# Patient Record
Sex: Male | Born: 1970 | Race: Black or African American | Hispanic: No | Marital: Single | Smoking: Never smoker
Health system: Southern US, Community
[De-identification: ages and names within clinical notes are randomized; demographics above are authoritative.]

## PROBLEM LIST (undated history)

## (undated) DIAGNOSIS — J309 Allergic rhinitis, unspecified: Secondary | ICD-10-CM

## (undated) DIAGNOSIS — T148XXA Other injury of unspecified body region, initial encounter: Secondary | ICD-10-CM

## (undated) DIAGNOSIS — L309 Dermatitis, unspecified: Secondary | ICD-10-CM

## (undated) DIAGNOSIS — R51 Headache: Secondary | ICD-10-CM

## (undated) DIAGNOSIS — K056 Periodontal disease, unspecified: Secondary | ICD-10-CM

## (undated) DIAGNOSIS — K649 Unspecified hemorrhoids: Secondary | ICD-10-CM

## (undated) DIAGNOSIS — E559 Vitamin D deficiency, unspecified: Secondary | ICD-10-CM

## (undated) DIAGNOSIS — B351 Tinea unguium: Secondary | ICD-10-CM

## (undated) HISTORY — DX: Periodontal disease, unspecified: K05.6

## (undated) HISTORY — DX: Other injury of unspecified body region, initial encounter: T14.8XXA

## (undated) HISTORY — DX: Allergic rhinitis, unspecified: J30.9

## (undated) HISTORY — DX: Unspecified hemorrhoids: K64.9

## (undated) HISTORY — DX: Dermatitis, unspecified: L30.9

## (undated) HISTORY — DX: Headache: R51

## (undated) HISTORY — DX: Tinea unguium: B35.1

## (undated) HISTORY — DX: Vitamin D deficiency, unspecified: E55.9

---

## 2003-05-06 ENCOUNTER — Encounter: Admission: RE | Admit: 2003-05-06 | Discharge: 2003-05-06 | Payer: Self-pay | Admitting: Internal Medicine

## 2005-10-31 IMAGING — CR DG SHOULDER 2+V*R*
3 series · 3 of 3 positions shown · non-contrast
Comparison: none

CLINICAL DATA: Right shoulder pain.  No trauma. 
 RIGHT SHOULDER

  There is no evidence of fracture or dislocation. No other significant bone or soft tissue abnormalities are identified.
 IMPRESSION
 Normal study.

[view not recorded (1 of 3)]
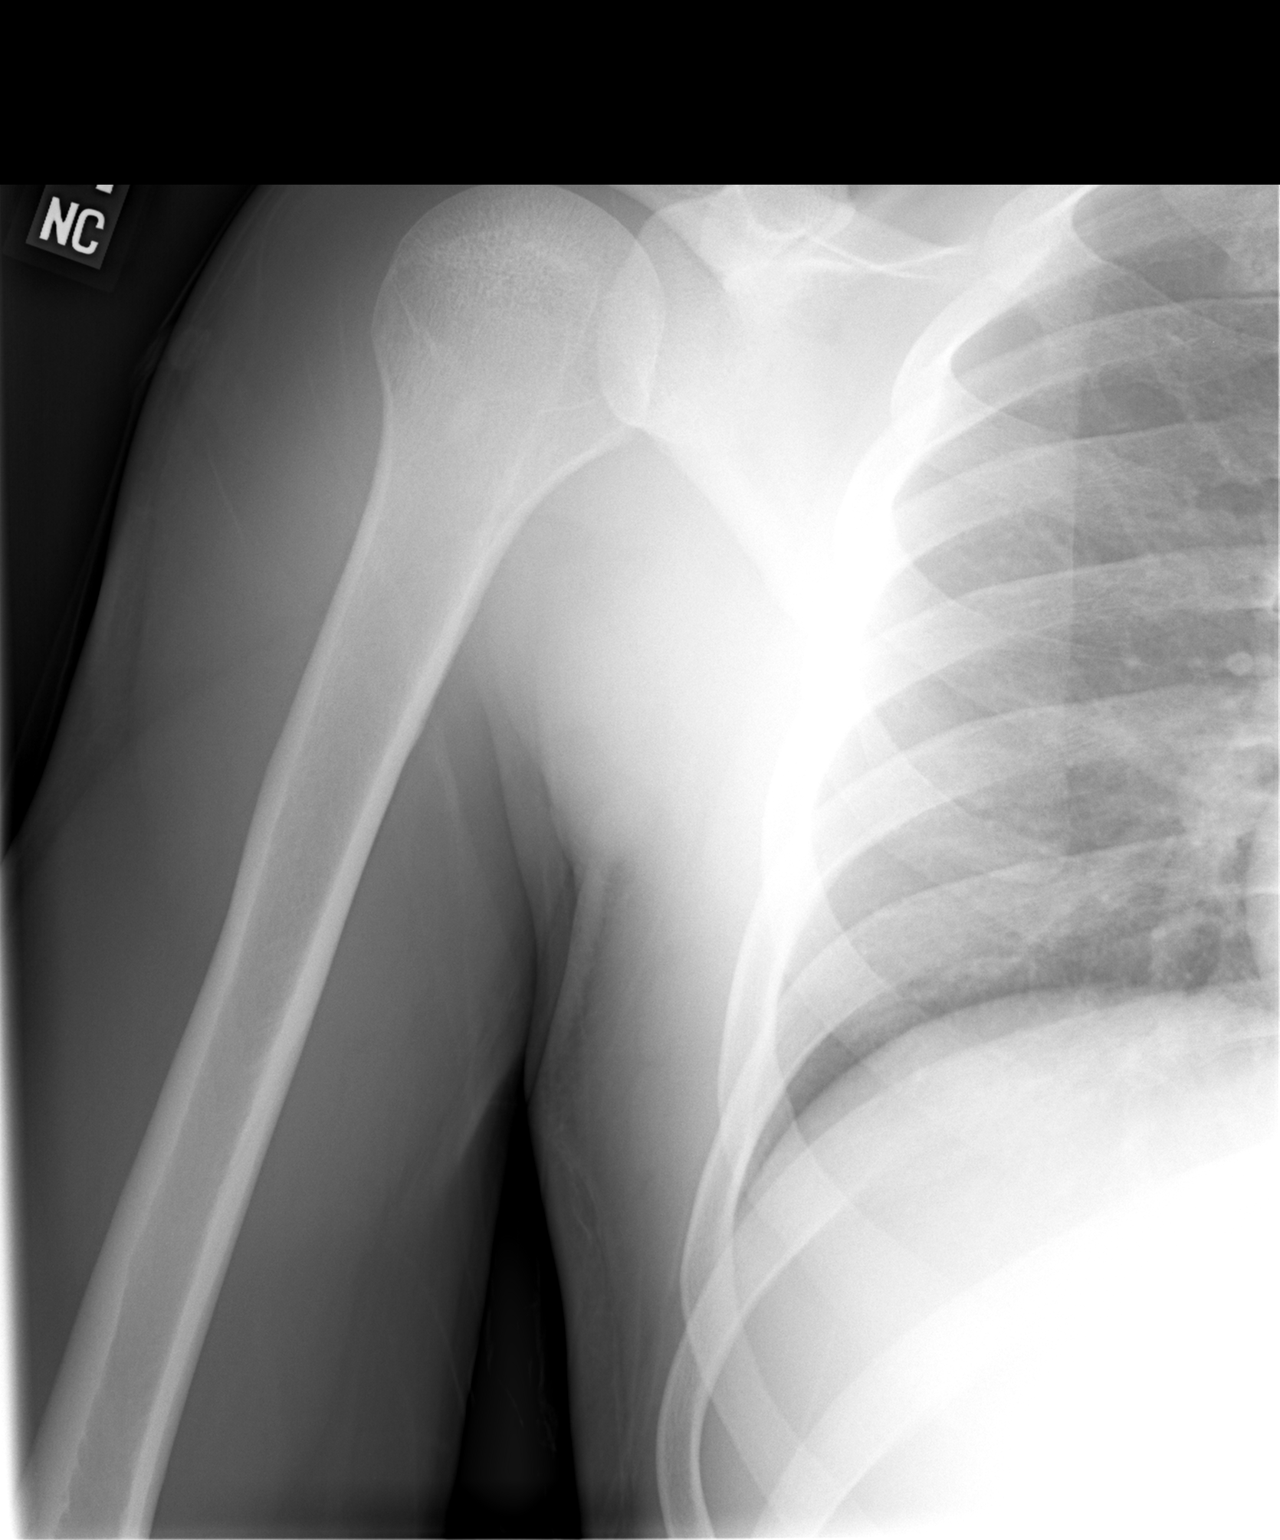

[view not recorded (2 of 3)]
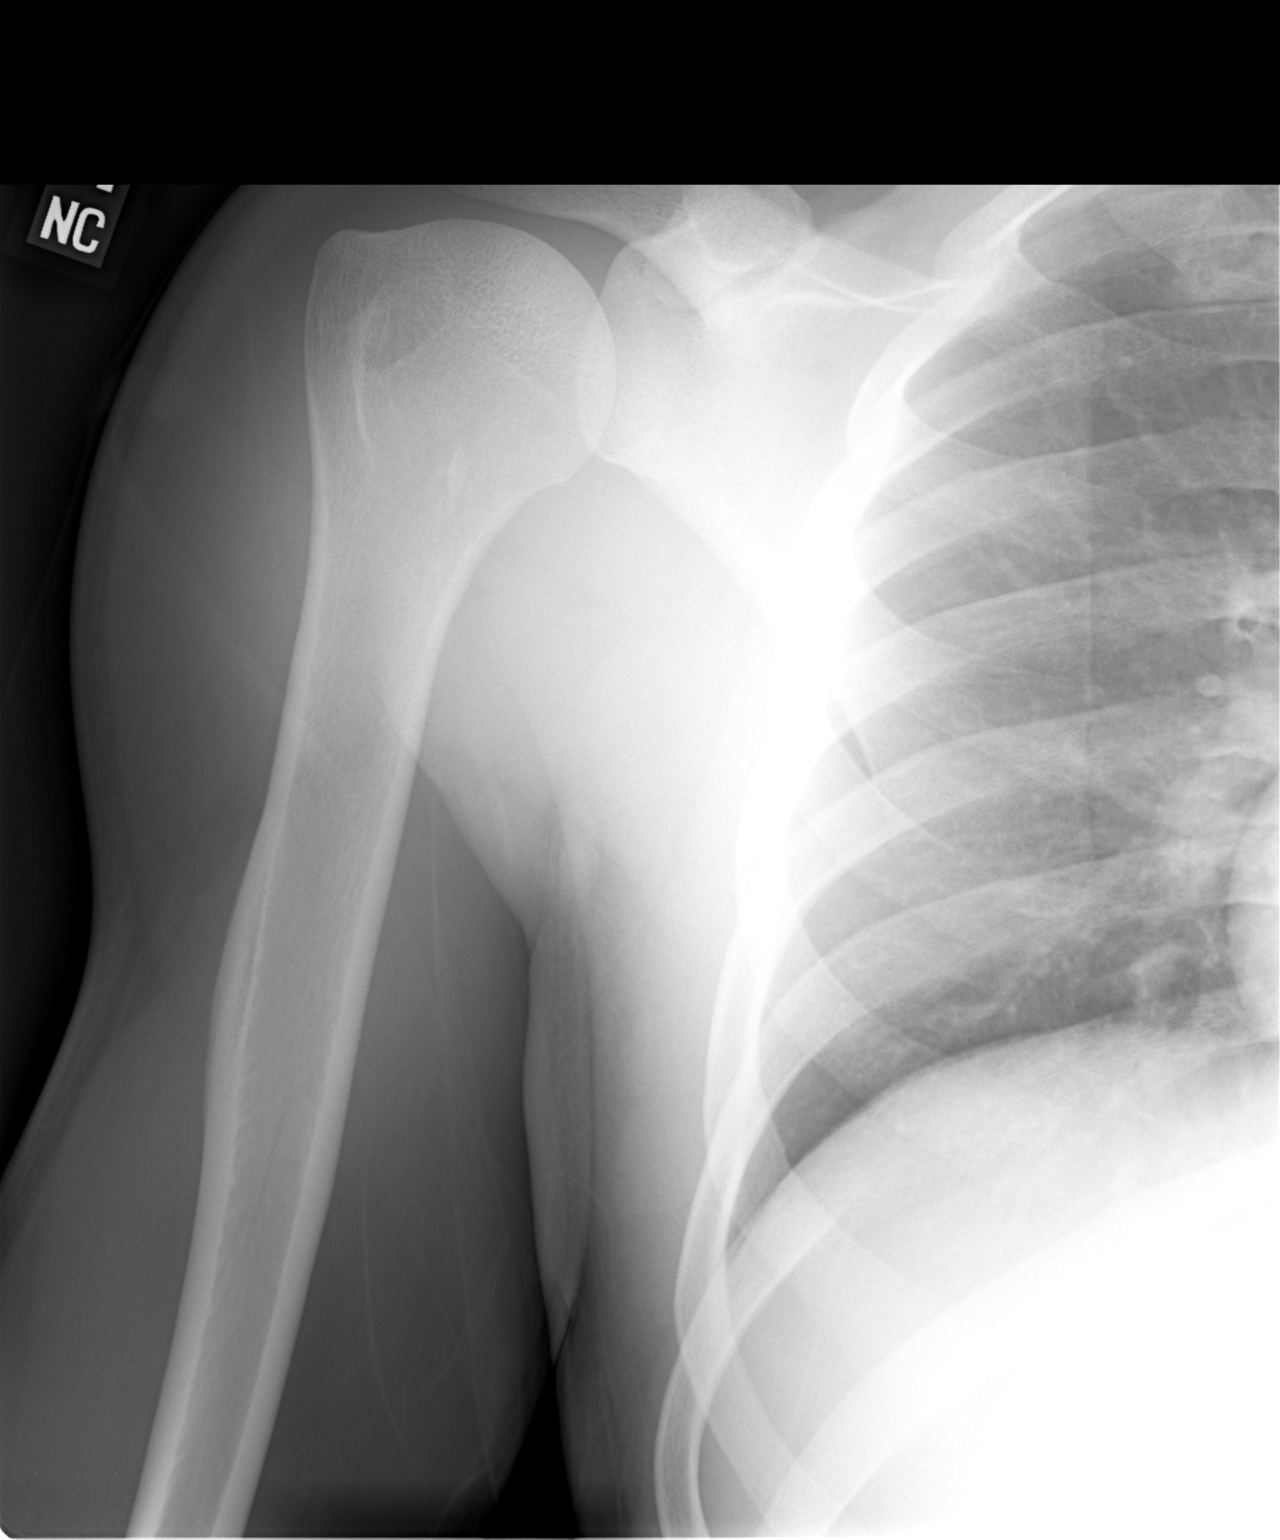

[view not recorded (3 of 3)]
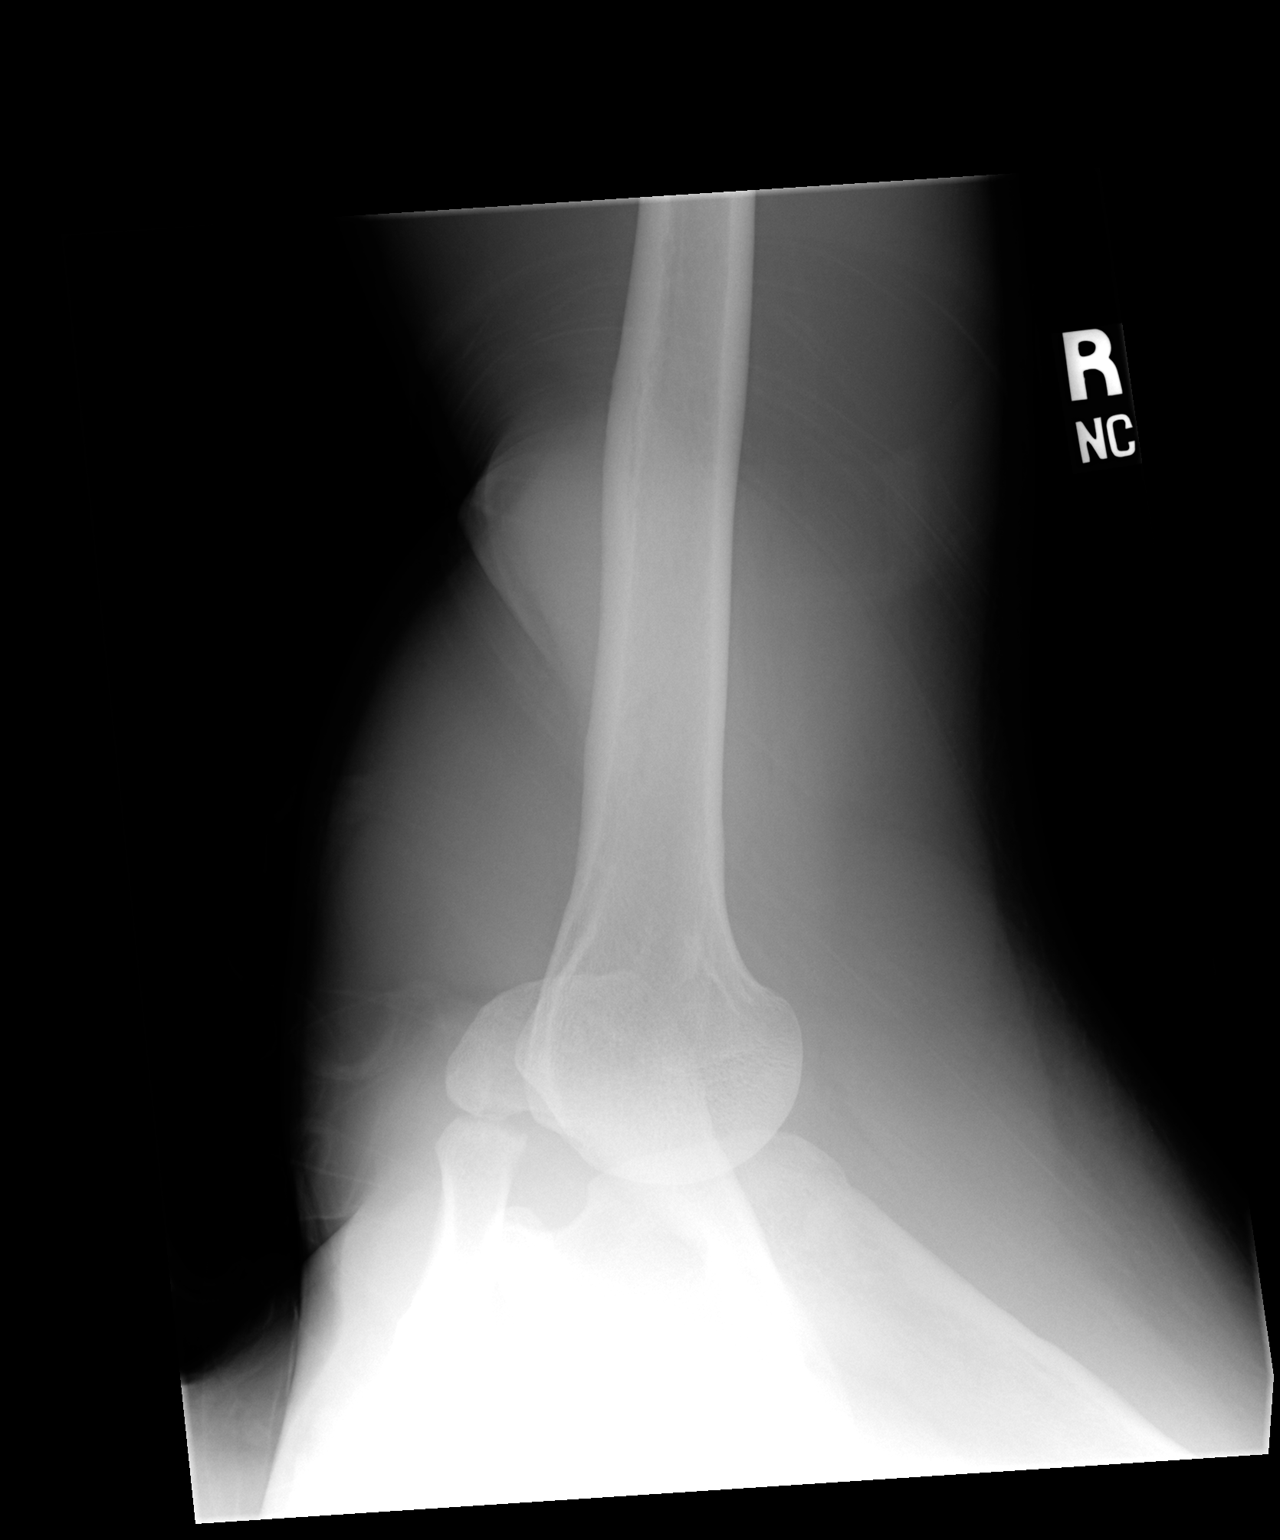

[3 of 3 positions shown; findings below may reference images not displayed]

## 2008-07-05 ENCOUNTER — Encounter: Admission: RE | Admit: 2008-07-05 | Discharge: 2008-07-05 | Payer: Self-pay | Admitting: Internal Medicine

## 2010-12-31 IMAGING — CR DG KNEE 1-2V*L*
2 series · 2 of 2 positions shown · non-contrast
Comparison: None available.

CLINICAL DATA: Left knee pain.

LEFT KNEE - 1-2 VIEW

[t knee ap left]
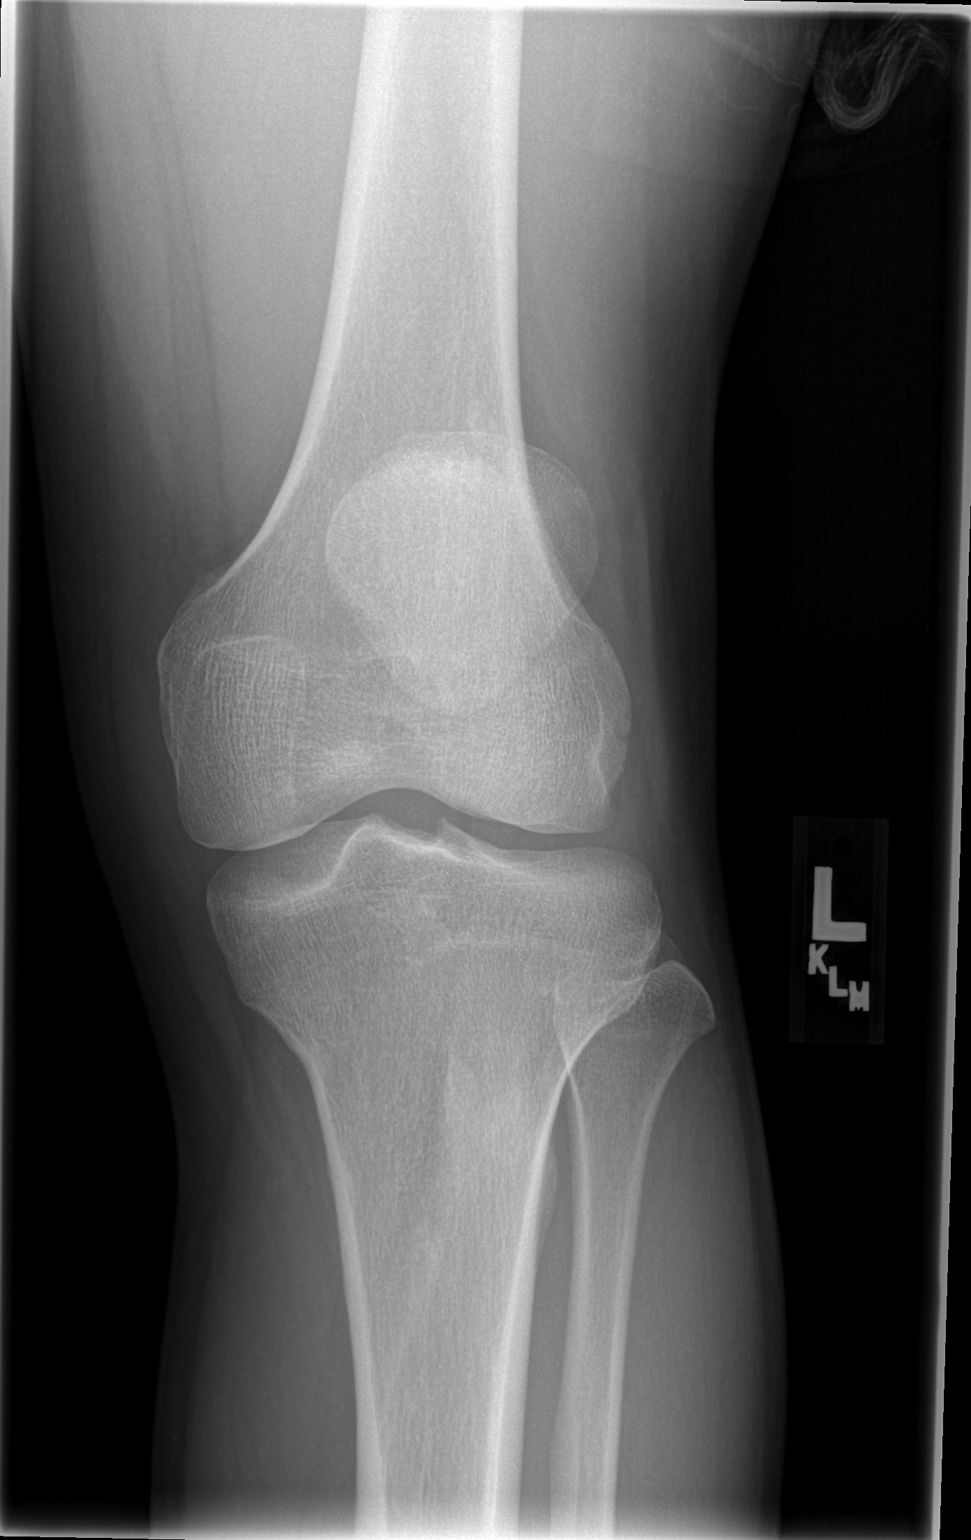

[t knee lat left]
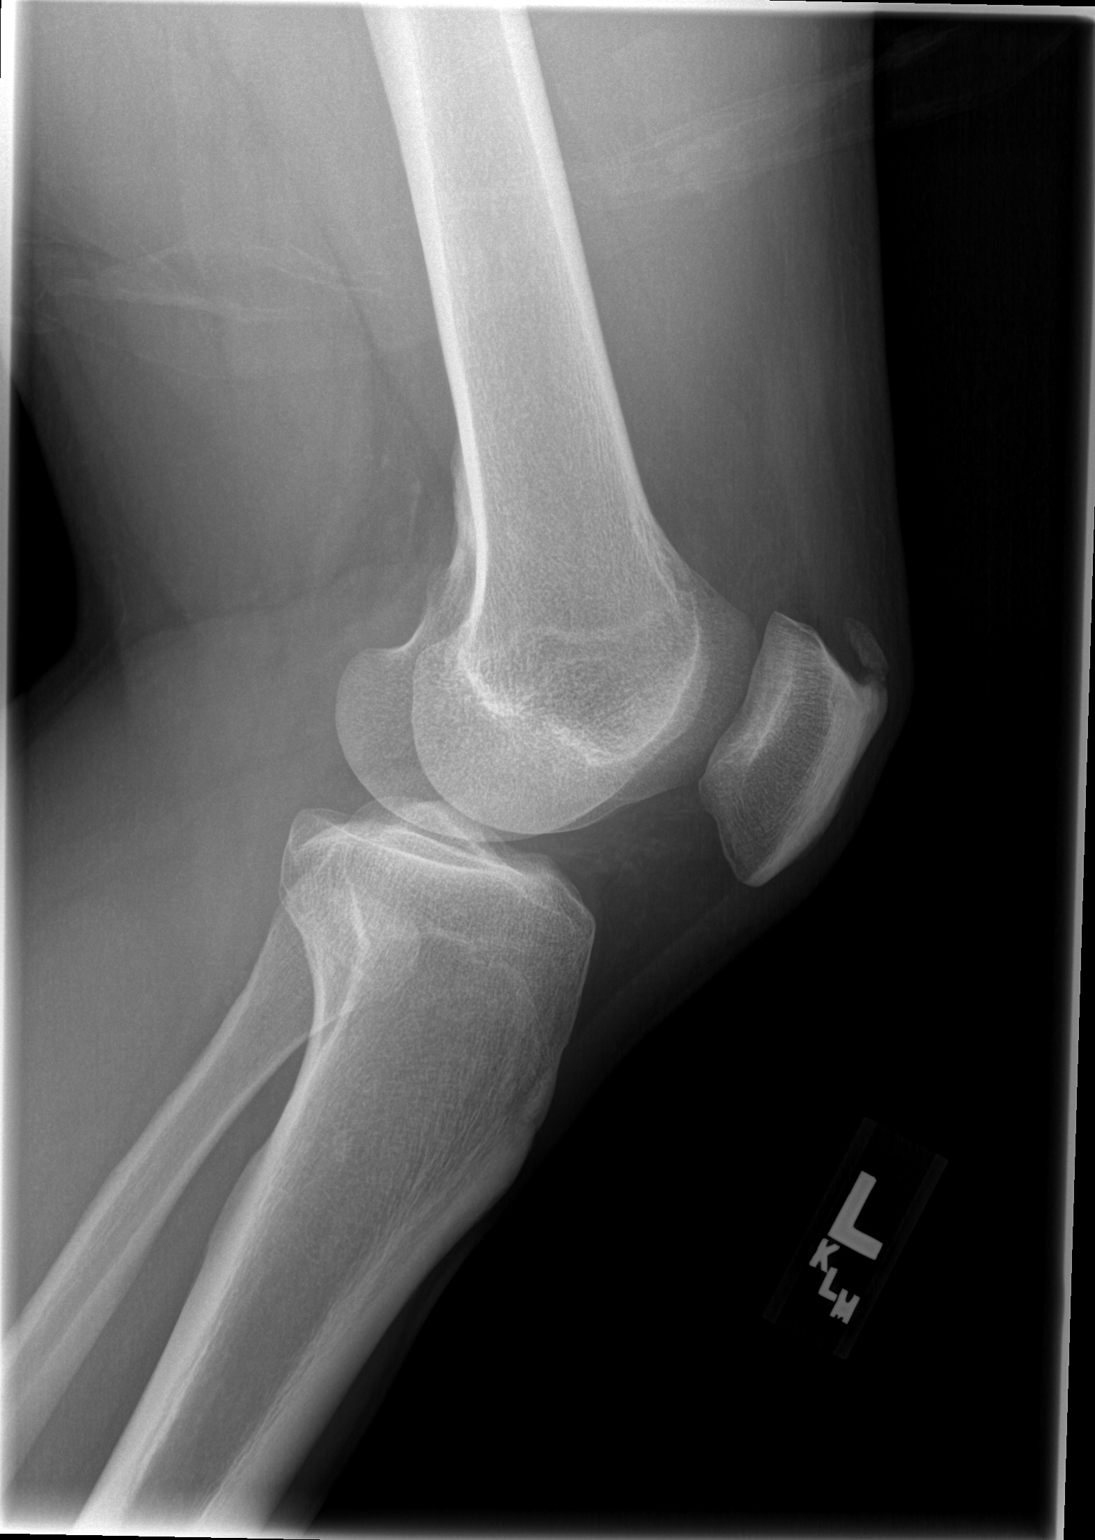

[2 of 2 positions shown; findings below may reference images not displayed]

FINDINGS: A moderate sized joint effusion is present.  There are
mild degenerative changes within the patellofemoral compartment and
to a lesser extent in the lateral compartment of the knee.  The
joint space appears to be maintained otherwise.  No acute osseous
abnormality is present.
IMPRESSION: 1.  Moderate sized joint effusion.  An inflammatory process or
internal derangement is not excluded.
2.  Mild degenerative changes involving the patellofemoral and
lateral compartments of the knee.

## 2012-01-29 ENCOUNTER — Encounter (HOSPITAL_COMMUNITY): Payer: Self-pay | Admitting: Hematology

## 2014-10-21 ENCOUNTER — Other Ambulatory Visit: Payer: Self-pay | Admitting: Orthopaedic Surgery

## 2014-11-10 NOTE — H&P (Signed)
Joshua Ortiz is an 44 y.o. male.   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 He is here about his left knee. For several years he's had some pain just above his patella. This has become severe at this point. It has not changed in many months. This keeps him from being able to work out and walk any significant distance. This will wake him from sleep. He has trouble with his knee bent more than about 10 minutes. He has tried a brace and some rubs and some pills. He obviously has tried rest as well.    Radiographs:  X-rays that were ordered, performed, and interpreted by me today included 4 views of the left knee. He has a very prominent spur off the superior aspect of the patella which appears to have an old fracture at its base. This looks central on the AP view. I don't see any such spur on the opposite knee on the AP view.  Past Medical History  Diagnosis Date  . Allergic rhinitis   . Vitamin D deficiency   . Musculoligamentous strain     status post, involving abdominal wall muscles  . Headache     noted frequent headaches  . Dermatitis      History of nonspecific dermatitis on his back  . Fungal infection of toenail     history of fungal infection toes  . Periodontal disease     History of mouth ulcers, Aphthous stomatitis  . Hemorrhoids     No past surgical history on file.  No family history on file. Social History:  reports that he has never smoked. He does not have any smokeless tobacco history on file. His alcohol and drug histories are not on file.  Allergies: No Known Allergies  No prescriptions prior to admission    No results found for this or any previous visit (from the past 48 hour(s)). No results found.  Review of Systems  Musculoskeletal: Positive for joint pain.       Left knee  All other systems reviewed and are negative.   There were no vitals  taken for this visit. Physical Exam  Constitutional: He is oriented to person, place, and time. He appears well-developed and well-nourished.  HENT:  Head: Normocephalic and atraumatic.  Eyes: Conjunctivae are normal. Pupils are equal, round, and reactive to light.  Neck: Normal range of motion.  Cardiovascular: Normal rate and regular rhythm.   Respiratory: Effort normal.  GI: Soft.  Musculoskeletal:  Left knee motion is full and there is no effusion. He has exquisite pain to palpation over the superior aspect of his patella. He can do an active straight leg raise without an extensor lag. Sensation and motor function are intact distally and he has palpable pulses on both feet. Calves are soft and nontender. Hip motion is full and straight leg raise is negative.   Neurological: He is alert and oriented to person, place, and time.  Skin: Skin is warm and dry.  Psychiatric: He has a normal mood and affect. His behavior is normal. Judgment and thought content normal.     Assessment/Plan Assessment:  Symptomatic left knee suprapatellar spur  Plan: Nedra HaiLee has some terrible pain at the superior pole of the patella. On x-ray he has a prominent spur at the quadriceps attachment site centrally. He has been struggling for years with this problem and is restricted in  terms of what he can do. I think we can potentially help him with a small operation where we will remove this piece of bone and repair the quadriceps most likely side to side. It could be that we have to put one suture anchor in place although I doubt that will be the case. I reviewed risk of anesthesia and infection and possibly quadriceps rupture with him in some detail. I told him we will protect him in a knee brace with his knee straight for about 2 weeks after the surgery. This certainly could change based on what we find intraoperatively.  Pauline Trainer PAUL 11/10/2014, 1:45 PM    

## 2014-11-12 ENCOUNTER — Encounter (HOSPITAL_BASED_OUTPATIENT_CLINIC_OR_DEPARTMENT_OTHER): Payer: Self-pay | Admitting: Certified Registered"

## 2014-11-12 ENCOUNTER — Ambulatory Visit (HOSPITAL_BASED_OUTPATIENT_CLINIC_OR_DEPARTMENT_OTHER)
Admission: RE | Admit: 2014-11-12 | Discharge: 2014-11-12 | Disposition: A | Discharge status: Home / Self Care | Payer: 59 | Source: Ambulatory Visit | Attending: Orthopaedic Surgery | Admitting: Orthopaedic Surgery

## 2014-11-12 ENCOUNTER — Encounter (HOSPITAL_BASED_OUTPATIENT_CLINIC_OR_DEPARTMENT_OTHER)
Admission: RE | Disposition: A | Discharge status: Home / Self Care | Payer: Self-pay | Source: Ambulatory Visit | Attending: Orthopaedic Surgery

## 2014-11-12 SURGERY — REPAIR, TENDON, QUADRICEPS
Anesthesia: General | Site: Knee | Laterality: Left

## 2014-11-15 ENCOUNTER — Encounter (HOSPITAL_BASED_OUTPATIENT_CLINIC_OR_DEPARTMENT_OTHER): Payer: Self-pay | Admitting: *Deleted

## 2014-11-17 ENCOUNTER — Ambulatory Visit (HOSPITAL_BASED_OUTPATIENT_CLINIC_OR_DEPARTMENT_OTHER): Payer: 59 | Admitting: Anesthesiology

## 2014-11-17 ENCOUNTER — Ambulatory Visit (HOSPITAL_BASED_OUTPATIENT_CLINIC_OR_DEPARTMENT_OTHER)
Admission: RE | Admit: 2014-11-17 | Discharge: 2014-11-17 | Disposition: A | Discharge status: Home / Self Care | Payer: 59 | Source: Ambulatory Visit | Attending: Orthopaedic Surgery | Admitting: Orthopaedic Surgery

## 2014-11-17 ENCOUNTER — Encounter (HOSPITAL_BASED_OUTPATIENT_CLINIC_OR_DEPARTMENT_OTHER): Payer: Self-pay | Admitting: Anesthesiology

## 2014-11-17 ENCOUNTER — Encounter (HOSPITAL_BASED_OUTPATIENT_CLINIC_OR_DEPARTMENT_OTHER)
Admission: RE | Disposition: A | Discharge status: Home / Self Care | Payer: Self-pay | Source: Ambulatory Visit | Attending: Orthopaedic Surgery

## 2014-11-17 DIAGNOSIS — M89762 Major osseous defect, left lower leg: Secondary | ICD-10-CM | POA: Insufficient documentation

## 2014-11-17 DIAGNOSIS — E559 Vitamin D deficiency, unspecified: Secondary | ICD-10-CM | POA: Diagnosis not present

## 2014-11-17 DIAGNOSIS — M25761 Osteophyte, right knee: Secondary | ICD-10-CM | POA: Insufficient documentation

## 2014-11-17 HISTORY — PX: QUADRICEPS TENDON REPAIR: SHX756

## 2014-11-17 SURGERY — REPAIR, TENDON, QUADRICEPS
Anesthesia: General | Site: Knee | Laterality: Left

## 2014-11-17 MED ORDER — BUPIVACAINE HCL (PF) 0.5 % IJ SOLN
INTRAMUSCULAR | Status: DC | PRN
  Administered 2014-11-17: 25 mL via PERINEURAL

## 2014-11-17 MED ORDER — MIDAZOLAM HCL 2 MG/2ML IJ SOLN
1.0000 mg | INTRAMUSCULAR | Status: DC | PRN
  Administered 2014-11-17: 2 mg via INTRAVENOUS

## 2014-11-17 MED ORDER — FENTANYL CITRATE (PF) 100 MCG/2ML IJ SOLN
INTRAMUSCULAR | Status: AC
Start: 2014-11-17 — End: 2014-11-17
  Filled 2014-11-17: qty 4

## 2014-11-17 MED ORDER — GLYCOPYRROLATE 0.2 MG/ML IJ SOLN
0.2000 mg | Freq: Once | INTRAMUSCULAR | Status: DC | PRN
Start: 2014-11-17 — End: 2014-11-17

## 2014-11-17 MED ORDER — LIDOCAINE HCL (CARDIAC) 20 MG/ML IV SOLN
INTRAVENOUS | Status: DC | PRN
  Administered 2014-11-17: 60 mg via INTRAVENOUS

## 2014-11-17 MED ORDER — LIDOCAINE-EPINEPHRINE (PF) 1.5 %-1:200000 IJ SOLN
INTRAMUSCULAR | Status: DC | PRN
  Administered 2014-11-17: 15 mL via PERINEURAL

## 2014-11-17 MED ORDER — FENTANYL CITRATE (PF) 100 MCG/2ML IJ SOLN
50.0000 ug | INTRAMUSCULAR | Status: DC | PRN
  Administered 2014-11-17: 100 ug via INTRAVENOUS
  Administered 2014-11-17: 25 ug via INTRAVENOUS

## 2014-11-17 MED ORDER — DEXAMETHASONE SODIUM PHOSPHATE 10 MG/ML IJ SOLN
INTRAMUSCULAR | Status: AC
  Filled 2014-11-17: qty 1

## 2014-11-17 MED ORDER — PROPOFOL 10 MG/ML IV BOLUS
INTRAVENOUS | Status: DC | PRN
  Administered 2014-11-17: 200 mg via INTRAVENOUS

## 2014-11-17 MED ORDER — HYDROCODONE-ACETAMINOPHEN 5-325 MG PO TABS: 1.0000 | ORAL_TABLET | Freq: Four times a day (QID) | ORAL | Status: AC | PRN

## 2014-11-17 MED ORDER — HYDROCODONE-ACETAMINOPHEN 5-325 MG PO TABS
ORAL_TABLET | ORAL | Status: AC
  Filled 2014-11-17: qty 1

## 2014-11-17 MED ORDER — LACTATED RINGERS IV SOLN: INTRAVENOUS | Status: DC

## 2014-11-17 MED ORDER — DEXAMETHASONE SODIUM PHOSPHATE 10 MG/ML IJ SOLN
INTRAMUSCULAR | Status: DC | PRN
  Administered 2014-11-17: 10 mg via INTRAVENOUS

## 2014-11-17 MED ORDER — ONDANSETRON HCL 4 MG/2ML IJ SOLN
INTRAMUSCULAR | Status: DC | PRN
  Administered 2014-11-17: 4 mg via INTRAVENOUS

## 2014-11-17 MED ORDER — FENTANYL CITRATE (PF) 100 MCG/2ML IJ SOLN
INTRAMUSCULAR | Status: AC
  Filled 2014-11-17: qty 2

## 2014-11-17 MED ORDER — MIDAZOLAM HCL 2 MG/2ML IJ SOLN
INTRAMUSCULAR | Status: AC
  Filled 2014-11-17: qty 2

## 2014-11-17 MED ORDER — PROPOFOL 10 MG/ML IV BOLUS
INTRAVENOUS | Status: AC
  Filled 2014-11-17: qty 20

## 2014-11-17 MED ORDER — LACTATED RINGERS IV SOLN
INTRAVENOUS | Status: DC
  Administered 2014-11-17 (×2): via INTRAVENOUS

## 2014-11-17 MED ORDER — HYDROCODONE-ACETAMINOPHEN 5-325 MG PO TABS
1.0000 | ORAL_TABLET | Freq: Once | ORAL | Status: AC
  Administered 2014-11-17: 1 via ORAL

## 2014-11-17 MED ORDER — SCOPOLAMINE 1 MG/3DAYS TD PT72: 1.0000 | MEDICATED_PATCH | Freq: Once | TRANSDERMAL | Status: DC | PRN

## 2014-11-17 MED ORDER — CHLORHEXIDINE GLUCONATE 4 % EX LIQD: 60.0000 mL | Freq: Once | CUTANEOUS | Status: DC

## 2014-11-17 MED ORDER — CEFAZOLIN SODIUM-DEXTROSE 2-3 GM-% IV SOLR
INTRAVENOUS | Status: AC
  Filled 2014-11-17: qty 50

## 2014-11-17 MED ORDER — CEFAZOLIN SODIUM-DEXTROSE 2-3 GM-% IV SOLR
2.0000 g | INTRAVENOUS | Status: AC
  Administered 2014-11-17: 2 g via INTRAVENOUS

## 2014-11-17 MED ORDER — ONDANSETRON HCL 4 MG/2ML IJ SOLN
INTRAMUSCULAR | Status: AC
  Filled 2014-11-17: qty 2

## 2014-11-17 SURGICAL SUPPLY — 64 items
BANDAGE ELASTIC 6 VELCRO ST LF (GAUZE/BANDAGES/DRESSINGS) ×3 IMPLANT
BANDAGE ESMARK 6X9 LF (GAUZE/BANDAGES/DRESSINGS) ×1 IMPLANT
BENZOIN TINCTURE PRP APPL 2/3 (GAUZE/BANDAGES/DRESSINGS) IMPLANT
BLADE SURG 10 STRL SS (BLADE) ×3 IMPLANT
BLADE SURG 15 STRL LF DISP TIS (BLADE) ×1 IMPLANT
BLADE SURG 15 STRL SS (BLADE) ×2
BNDG ESMARK 6X9 LF (GAUZE/BANDAGES/DRESSINGS) ×3
BNDG GAUZE ELAST 4 BULKY (GAUZE/BANDAGES/DRESSINGS) ×3 IMPLANT
BUR EGG 3PK/BX (BURR) IMPLANT
BUR PEAR (BURR) IMPLANT
CANISTER SUCT 1200ML W/VALVE (MISCELLANEOUS) ×3 IMPLANT
CLEANER CAUTERY TIP 5X5 PAD (MISCELLANEOUS) IMPLANT
CLOSURE WOUND 1/2 X4 (GAUZE/BANDAGES/DRESSINGS)
CUFF TOURNIQUET SINGLE 24IN (TOURNIQUET CUFF) IMPLANT
CUFF TOURNIQUET SINGLE 34IN LL (TOURNIQUET CUFF) ×3 IMPLANT
DRAPE EXTREMITY T 121X128X90 (DRAPE) ×3 IMPLANT
DRAPE U-SHAPE 47X51 STRL (DRAPES) ×3 IMPLANT
DRSG ADAPTIC 3X8 NADH LF (GAUZE/BANDAGES/DRESSINGS) IMPLANT
DRSG EMULSION OIL 3X3 NADH (GAUZE/BANDAGES/DRESSINGS) IMPLANT
DRSG PAD ABDOMINAL 8X10 ST (GAUZE/BANDAGES/DRESSINGS) ×3 IMPLANT
DURAPREP 26ML APPLICATOR (WOUND CARE) ×3 IMPLANT
ELECT REM PT RETURN 9FT ADLT (ELECTROSURGICAL) ×3
ELECTRODE REM PT RTRN 9FT ADLT (ELECTROSURGICAL) ×1 IMPLANT
GAUZE SPONGE 4X4 12PLY STRL (GAUZE/BANDAGES/DRESSINGS) ×3 IMPLANT
GLOVE BIO SURGEON STRL SZ 6.5 (GLOVE) ×2 IMPLANT
GLOVE BIO SURGEON STRL SZ8 (GLOVE) ×6 IMPLANT
GLOVE BIO SURGEONS STRL SZ 6.5 (GLOVE) ×1
GLOVE BIOGEL PI IND STRL 7.0 (GLOVE) ×2 IMPLANT
GLOVE BIOGEL PI IND STRL 8 (GLOVE) ×2 IMPLANT
GLOVE BIOGEL PI INDICATOR 7.0 (GLOVE) ×4
GLOVE BIOGEL PI INDICATOR 8 (GLOVE) ×4
GOWN STRL REUS W/ TWL LRG LVL3 (GOWN DISPOSABLE) ×1 IMPLANT
GOWN STRL REUS W/ TWL XL LVL3 (GOWN DISPOSABLE) ×2 IMPLANT
GOWN STRL REUS W/TWL LRG LVL3 (GOWN DISPOSABLE) ×2
GOWN STRL REUS W/TWL XL LVL3 (GOWN DISPOSABLE) ×4
IMMOBILIZER KNEE 24 THIGH 36 (MISCELLANEOUS) ×1 IMPLANT
IMMOBILIZER KNEE 24 UNIV (MISCELLANEOUS) ×3
KNEE WRAP E Z 3 GEL PACK (MISCELLANEOUS) ×3 IMPLANT
NS IRRIG 1000ML POUR BTL (IV SOLUTION) ×3 IMPLANT
PACK ARTHROSCOPY DSU (CUSTOM PROCEDURE TRAY) ×3 IMPLANT
PACK BASIN DAY SURGERY FS (CUSTOM PROCEDURE TRAY) ×3 IMPLANT
PAD CLEANER CAUTERY TIP 5X5 (MISCELLANEOUS)
PADDING CAST ABS 4INX4YD NS (CAST SUPPLIES) ×2
PADDING CAST ABS COTTON 4X4 ST (CAST SUPPLIES) ×1 IMPLANT
PASSER SUT SWANSON 36MM LOOP (INSTRUMENTS) IMPLANT
PENCIL BUTTON HOLSTER BLD 10FT (ELECTRODE) ×3 IMPLANT
SLEEVE SCD COMPRESS KNEE MED (MISCELLANEOUS) IMPLANT
SPONGE LAP 18X18 X RAY DECT (DISPOSABLE) ×3 IMPLANT
STAPLER VISISTAT 35W (STAPLE) ×3 IMPLANT
STRIP CLOSURE SKIN 1/2X4 (GAUZE/BANDAGES/DRESSINGS) IMPLANT
SUT ETHIBOND 2 OS 4 DA (SUTURE) IMPLANT
SUT FIBERWIRE #2 38 T-5 BLUE (SUTURE)
SUT MNCRL AB 4-0 PS2 18 (SUTURE) ×3 IMPLANT
SUT VIC AB 0 CT1 27 (SUTURE) ×2
SUT VIC AB 0 CT1 27XBRD ANBCTR (SUTURE) ×1 IMPLANT
SUT VIC AB 1 CT1 27 (SUTURE) ×2
SUT VIC AB 1 CT1 27XBRD ANBCTR (SUTURE) ×1 IMPLANT
SUT VIC AB 2-0 SH 27 (SUTURE)
SUT VIC AB 2-0 SH 27XBRD (SUTURE) IMPLANT
SUTURE FIBERWR #2 38 T-5 BLUE (SUTURE) IMPLANT
SYR BULB 3OZ (MISCELLANEOUS) ×3 IMPLANT
TOWEL OR 17X24 6PK STRL BLUE (TOWEL DISPOSABLE) ×3 IMPLANT
TOWEL OR NON WOVEN STRL DISP B (DISPOSABLE) IMPLANT
YANKAUER SUCT BULB TIP NO VENT (SUCTIONS) ×3 IMPLANT

## 2014-11-17 NOTE — Op Note (Signed)
#  [id number] 

## 2014-11-17 NOTE — Discharge Instructions (Signed)
° ° °  Regional Anesthesia Blocks ° °1. Numbness or the inability to move the "blocked" extremity may last from 3-48 hours after placement. The length of time depends on the medication injected and your individual response to the medication. If the numbness is not going away after 48 hours, call your surgeon. ° °2. The extremity that is blocked will need to be protected until the numbness is gone and the  Strength has returned. Because you cannot feel it, you will need to take extra care to avoid injury. Because it may be weak, you may have difficulty moving it or using it. You may not know what position it is in without looking at it while the block is in effect. ° °3. For blocks in the legs and feet, returning to weight bearing and walking needs to be done carefully. You will need to wait until the numbness is entirely gone and the strength has returned. You should be able to move your leg and foot normally before you try and bear weight or walk. You will need someone to be with you when you first try to ensure you do not fall and possibly risk injury. ° °4. Bruising and tenderness at the needle site are common side effects and will resolve in a few days. ° °5. Persistent numbness or new problems with movement should be communicated to the surgeon or the [facility] ([phone])/ [facility] ([phone]). ° ° ° °Post Anesthesia Home Care Instructions ° °Activity: °Get plenty of rest for the remainder of the day. A responsible adult should stay with you for 24 hours following the procedure.  °For the next 24 hours, DO NOT: °-Drive a car °-Operate machinery °-Drink alcoholic beverages °-Take any medication unless instructed by your physician °-Make any legal decisions or sign important papers. ° °Meals: °Start with liquid foods such as gelatin or soup. Progress to regular foods as tolerated. Avoid greasy, spicy, heavy foods. If nausea and/or vomiting occur, drink only clear liquids until  the nausea and/or vomiting subsides. Call your physician if vomiting continues. ° °Special Instructions/Symptoms: °Your throat may feel dry or sore from the anesthesia or the breathing tube placed in your throat during surgery. If this causes discomfort, gargle with warm salt water. The discomfort should disappear within 24 hours. ° °If you had a scopolamine patch placed behind your ear for the management of post- operative nausea and/or vomiting: ° °1. The medication in the patch is effective for 72 hours, after which it should be removed.  Wrap patch in a tissue and discard in the trash. Wash hands thoroughly with soap and water. °2. You may remove the patch earlier than 72 hours if you experience unpleasant side effects which may include dry mouth, dizziness or visual disturbances. °3. Avoid touching the patch. Wash your hands with soap and water after contact with the patch. °  ° °

## 2014-11-17 NOTE — Transfer of Care (Signed)
Immediate Anesthesia Transfer of Care Note  Patient: Joshua MorrowWillie L Ortiz  Procedure(s) Performed: Procedure(s): LEFT KNEE QUAD TENDON REPAIR EXCISION OSSICLE (Left)  Patient Location: PACU  Anesthesia Type:General and GA combined with regional for post-op pain  Level of Consciousness: sedated  Airway & Oxygen Therapy: Patient Spontanous Breathing and Patient connected to face mask oxygen  Post-op Assessment: Report given to RN and Post -op Vital signs reviewed and stable  Post vital signs: Reviewed and stable  Last Vitals:  Filed Vitals:   11/17/14 1209  BP:   Pulse: 70  Temp:   Resp: 14    Complications: No apparent anesthesia complications

## 2014-11-17 NOTE — Progress Notes (Signed)
Assisted Dr. Rose with left, ultrasound guided, femoral block. Side rails up, monitors on throughout procedure. See vital signs in flow sheet. Tolerated Procedure well. 

## 2014-11-17 NOTE — Anesthesia Postprocedure Evaluation (Signed)
Anesthesia Post Note  Patient: Joshua Ortiz  Procedure(s) Performed: Procedure(s) (LRB): LEFT KNEE QUAD TENDON REPAIR EXCISION OSSICLE (Left)  Anesthesia type: General  Patient location: PACU  Post pain: Pain level controlled and Adequate analgesia  Post assessment: Post-op Vital signs reviewed, Patient's Cardiovascular Status Stable, Respiratory Function Stable, Patent Airway and Pain level controlled  Last Vitals:  Filed Vitals:   11/17/14 1430  BP: 133/88  Pulse: 83  Temp:   Resp: 15    Post vital signs: Reviewed and stable  Level of consciousness: awake, alert  and oriented  Complications: No apparent anesthesia complications

## 2014-11-17 NOTE — H&P (View-Only) (Signed)
Joshua Ortiz is an 44 y.o. male.   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 He is here about his left knee. For several years he's had some pain just above his patella. This has become severe at this point. It has not changed in many months. This keeps him from being able to work out and walk any significant distance. This will wake him from sleep. He has trouble with his knee bent more than about 10 minutes. He has tried a brace and some rubs and some pills. He obviously has tried rest as well.    Radiographs:  X-rays that were ordered, performed, and interpreted by me today included 4 views of the left knee. He has a very prominent spur off the superior aspect of the patella which appears to have an old fracture at its base. This looks central on the AP view. I don't see any such spur on the opposite knee on the AP view.  Past Medical History  Diagnosis Date  . Allergic rhinitis   . Vitamin D deficiency   . Musculoligamentous strain     status post, involving abdominal wall muscles  . Headache     noted frequent headaches  . Dermatitis      History of nonspecific dermatitis on his back  . Fungal infection of toenail     history of fungal infection toes  . Periodontal disease     History of mouth ulcers, Aphthous stomatitis  . Hemorrhoids     No past surgical history on file.  No family history on file. Social History:  reports that he has never smoked. He does not have any smokeless tobacco history on file. His alcohol and drug histories are not on file.  Allergies: No Known Allergies  No prescriptions prior to admission    No results found for this or any previous visit (from the past 48 hour(s)). No results found.  Review of Systems  Musculoskeletal: Positive for joint pain.       Left knee  All other systems reviewed and are negative.   There were no vitals  taken for this visit. Physical Exam  Constitutional: He is oriented to person, place, and time. He appears well-developed and well-nourished.  HENT:  Head: Normocephalic and atraumatic.  Eyes: Conjunctivae are normal. Pupils are equal, round, and reactive to light.  Neck: Normal range of motion.  Cardiovascular: Normal rate and regular rhythm.   Respiratory: Effort normal.  GI: Soft.  Musculoskeletal:  Left knee motion is full and there is no effusion. He has exquisite pain to palpation over the superior aspect of his patella. He can do an active straight leg raise without an extensor lag. Sensation and motor function are intact distally and he has palpable pulses on both feet. Calves are soft and nontender. Hip motion is full and straight leg raise is negative.   Neurological: He is alert and oriented to person, place, and time.  Skin: Skin is warm and dry.  Psychiatric: He has a normal mood and affect. His behavior is normal. Judgment and thought content normal.     Assessment/Plan Assessment:  Symptomatic left knee suprapatellar spur  Plan: Joshua Ortiz has some terrible pain at the superior pole of the patella. On x-ray he has a prominent spur at the quadriceps attachment site centrally. He has been struggling for years with this problem and is restricted in  terms of what he can do. I think we can potentially help him with a small operation where we will remove this piece of bone and repair the quadriceps most likely side to side. It could be that we have to put one suture anchor in place although I doubt that will be the case. I reviewed risk of anesthesia and infection and possibly quadriceps rupture with him in some detail. I told him we will protect him in a knee brace with his knee straight for about 2 weeks after the surgery. This certainly could change based on what we find intraoperatively.  Joshua Ortiz, Ginger Organ 11/10/2014, 1:45 PM

## 2014-11-17 NOTE — Anesthesia Procedure Notes (Addendum)
Anesthesia Regional Block:  Femoral nerve block  Pre-Anesthetic Checklist: ,, timeout performed, Correct Patient, Correct Site, Correct Laterality, Correct Procedure, Correct Position, site marked, Risks and benefits discussed,  Surgical consent,  Pre-op evaluation,  At surgeon's request and post-op pain management  Laterality: Left  Prep: chloraprep       Needles:  Injection technique: Single-shot  Needle Type: Echogenic Needle     Needle Length: 9cm 9 cm Needle Gauge: 21 and 21 G    Additional Needles:  Procedures: ultrasound guided (picture in chart) Femoral nerve block Narrative:  Injection made incrementally with aspirations every 5 mL.  Performed by: Personally   Additional Notes: Patient tolerated the procedure well without complications   Procedure Name: LMA Insertion Date/Time: 11/17/2014 12:47 PM Performed by: Burna CashONRAD, Carlas Vandyne C Pre-anesthesia Checklist: Patient identified, Emergency Drugs available, Suction available and Patient being monitored Patient Re-evaluated:Patient Re-evaluated prior to inductionOxygen Delivery Method: Circle System Utilized Preoxygenation: Pre-oxygenation with 100% oxygen Intubation Type: IV induction Ventilation: Mask ventilation without difficulty LMA: LMA inserted LMA Size: 5.0 Number of attempts: 1 Airway Equipment and Method: Bite block Placement Confirmation: positive ETCO2 Tube secured with: Tape Dental Injury: Teeth and Oropharynx as per pre-operative assessment

## 2014-11-17 NOTE — Anesthesia Preprocedure Evaluation (Signed)
Anesthesia Evaluation  Patient identified by MRN, date of birth, ID band Patient awake    Reviewed: Allergy & Precautions, NPO status , Patient's Chart, lab work & pertinent test results  Airway Mallampati: II  TM Distance: >3 FB Neck ROM: Full    Dental no notable dental hx.    Pulmonary neg pulmonary ROS,    Pulmonary exam normal breath sounds clear to auscultation       Cardiovascular negative cardio ROS Normal cardiovascular exam Rhythm:Regular Rate:Normal     Neuro/Psych negative neurological ROS  negative psych ROS   GI/Hepatic negative GI ROS, Neg liver ROS,   Endo/Other  negative endocrine ROS  Renal/GU negative Renal ROS  negative genitourinary   Musculoskeletal negative musculoskeletal ROS (+)   Abdominal   Peds negative pediatric ROS (+)  Hematology negative hematology ROS (+)   Anesthesia Other Findings   Reproductive/Obstetrics negative OB ROS                             Anesthesia Physical Anesthesia Plan  ASA: I  Anesthesia Plan: General   Post-op Pain Management:    Induction: Intravenous  Airway Management Planned: LMA  Additional Equipment:   Intra-op Plan:   Post-operative Plan: Extubation in OR  Informed Consent: I have reviewed the patients History and Physical, chart, labs and discussed the procedure including the risks, benefits and alternatives for the proposed anesthesia with the patient or authorized representative who has indicated his/her understanding and acceptance.   Dental advisory given  Plan Discussed with: CRNA and Surgeon  Anesthesia Plan Comments:         Anesthesia Quick Evaluation  

## 2014-11-17 NOTE — Interval H&P Note (Signed)
OK for surgery PD 

## 2014-11-18 ENCOUNTER — Encounter (HOSPITAL_BASED_OUTPATIENT_CLINIC_OR_DEPARTMENT_OTHER): Payer: Self-pay | Admitting: Orthopaedic Surgery

## 2014-11-18 NOTE — Op Note (Signed)
NAME:  Joshua Ortiz, Joshua Ortiz NO.:  [id number]  MEDICAL RECORD NO.:  [id number]  LOCATION:                               FACILITY:  [facility]  PHYSICIAN:  Lubertha Basque. Iszabella Hebenstreit, M.D.DATE OF BIRTH:  1970-12-31  DATE OF PROCEDURE:  11/17/2014 DATE OF DISCHARGE:  11/17/2014                              OPERATIVE REPORT   PREOPERATIVE DIAGNOSIS:  Left knee ossicle.  POSTOPERATIVE DIAGNOSIS:  Left knee ossicle.  PROCEDURES: 1. Left knee ossicle excision. 2. Left knee quadriceps tendon repair.  ANESTHESIA:  General and block.  ATTENDING SURGEON:  Lubertha Basque. Jerl Santos, M.D.  ASSISTANT:  Elodia Florence, PA.  INDICATION FOR PROCEDURE:  The patient is a 44 year old male with many year history of a painful left knee.  His pain has been on the superior pole of the patella.  This has persisted despite bracing and pads and pills and therapy.  By x-ray, he has an obvious ossicle on the superior pole of the patella.  This is terribly painful for him.  He is offered excision.  Informed operative consent was obtained after discussion of possible complications including reaction to anesthesia, infection, and quadriceps tendon rupture.  SUMMARY OF FINDINGS AND PROCEDURE:  Under general anesthesia and a block, a left knee procedure was performed.  I excised an ossicle and used fluoroscopy throughout the case to make appropriate intraoperative decisions.  I read all of these views myself.  I then had to repair a side-to-side split in the quadriceps tendon with suture.  It was closed primarily and discharged home.  DESCRIPTION OF PROCEDURE:  The patient was taken to the operating suite where general anesthetic was applied without difficulty.  He was also given a block in the pre-anesthesia area.  He was positioned supine with a bump under the left hip and prepped and draped in normal sterile fashion.  After the administration of brief IV Kefzol and appropriate time-out, the left leg was  elevated, exsanguinated, and tourniquet inflated about the thigh.  I made a longitudinal incision over the superior pole of the patella and dissected down to the quadriceps.  I identified the spur via fluoroscopy and made a longitudinal split in the quadriceps tendon and excised the spur, which was in the tendon itself. I did my best to maintain the longitudinal integrity of the quadriceps tendon.  This did require longitudinal split in the tendon.  I used fluoroscopy to confirm adequate resection of the ossicle and then repaired the tendon side-to-side with #1 Vicryl suture.  The wound was irrigated.  The tourniquet was deflated and a small amount of bleeding was easily controlled with some pressure and Bovie cautery. Subcutaneous tissues were reapproximated with 2-0 undyed Vicryl and skin was closed with subcuticular stitch.  Adaptic was applied followed by dry gauze and a loose Ace wrap and a knee immobilizer.  Estimated blood loss and intraoperative fluids can be obtained from anesthesia records as can accurate tourniquet time.  DISPOSITION:  The patient was extubated in the operating room and taken to the recovery room in a stable addition.  He was to go home same-day and follow up in the office closely.  I will contact him by  phone tonight.     Lubertha BasquePeter G. Jerl Santosalldorf, M.D.     PGD/MEDQ  D:  11/17/2014  T:  11/18/2014  Job:  [id number]

## 2019-12-28 ENCOUNTER — Ambulatory Visit: Payer: 59 | Attending: Internal Medicine

## 2019-12-28 DIAGNOSIS — Z23 Encounter for immunization: Secondary | ICD-10-CM

## 2019-12-28 NOTE — Progress Notes (Signed)
   Covid-19 Vaccination Clinic  Name:  Joshua Ortiz    MRN: [id number] DOB: February 12, 1970  12/28/2019  Joshua Ortiz was observed post Covid-19 immunization for 15 minutes without incident. He was provided with Vaccine Information Sheet and instruction to access the V-Safe system.   Joshua Ortiz was instructed to call 911 with any severe reactions post vaccine: Marland Kitchen Difficulty breathing  . Swelling of face and throat  . A fast heartbeat  . A bad rash all over body  . Dizziness and weakness   Immunizations Administered    Name Date Dose VIS Date Route   Pfizer COVID-19 Vaccine 12/28/2019  1:47 PM 0.3 mL 10/28/2019 Intramuscular   Manufacturer: [employer], [employer]   Lot: [id number]   NDC: [id number]

## 2021-01-28 ENCOUNTER — Emergency Department (HOSPITAL_COMMUNITY)
Admission: EM | Admit: 2021-01-28 | Discharge: 2021-01-28 | Disposition: A | Discharge status: Home / Self Care | Payer: 59 | Attending: Emergency Medicine | Admitting: Emergency Medicine

## 2021-01-28 ENCOUNTER — Encounter (HOSPITAL_COMMUNITY): Payer: Self-pay

## 2021-01-28 DIAGNOSIS — R944 Abnormal results of kidney function studies: Secondary | ICD-10-CM | POA: Insufficient documentation

## 2021-01-28 DIAGNOSIS — R55 Syncope and collapse: Secondary | ICD-10-CM | POA: Diagnosis present

## 2021-01-28 DIAGNOSIS — R42 Dizziness and giddiness: Secondary | ICD-10-CM | POA: Insufficient documentation

## 2021-01-28 DIAGNOSIS — R001 Bradycardia, unspecified: Secondary | ICD-10-CM | POA: Diagnosis not present

## 2021-01-28 LAB — CBC
HCT: 45.6 % (ref 39.0–52.0)
Hemoglobin: 15 g/dL (ref 13.0–17.0)
MCH: 29.4 pg (ref 26.0–34.0)
MCHC: 32.9 g/dL (ref 30.0–36.0)
MCV: 89.4 fL (ref 80.0–100.0)
Platelets: 185 10*3/uL (ref 150–400)
RBC: 5.1 MIL/uL (ref 4.22–5.81)
RDW: 13.7 % (ref 11.5–15.5)
WBC: 4.5 10*3/uL (ref 4.0–10.5)
nRBC: 0 % (ref 0.0–0.2)

## 2021-01-28 LAB — BASIC METABOLIC PANEL
Anion gap: 8 (ref 5–15)
BUN: 16 mg/dL (ref 6–20)
CO2: 27 mmol/L (ref 22–32)
Calcium: 8.9 mg/dL (ref 8.9–10.3)
Chloride: 107 mmol/L (ref 98–111)
Creatinine, Ser: 1.45 mg/dL — ABNORMAL HIGH (ref 0.61–1.24)
GFR, Estimated: 58 mL/min — ABNORMAL LOW (ref >60)
Glucose, Bld: 87 mg/dL (ref 70–99)
Potassium: 3.6 mmol/L (ref 3.5–5.1)
Sodium: 142 mmol/L (ref 135–145)

## 2021-01-28 LAB — TROPONIN I (HIGH SENSITIVITY)
Troponin I (High Sensitivity): 3 ng/L (ref <18)
Troponin I (High Sensitivity): <2 ng/L (ref <18)

## 2021-01-28 NOTE — Discharge Instructions (Signed)
Your creatinine is mildly elevated.  Follow-up with a primary care doctor for further evaluation of this.  Try and keep yourself hydrated.  I would not take more of the DayQuil.

## 2021-01-28 NOTE — ED Triage Notes (Addendum)
Pt BIBA from [facility]. EMS was called as pt "passed out" in barber's chair. Pt then "passed out" 2x with "eyes rolling back in head" on EMS for 10-20 seconds. Pt was diaphoretic and HR was 40-50, with a pressure of 100/80 and thready radial pulses.   Pt states he had dayquil before he arrived without eating prior, along with an antifungal med
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
## 2021-12-02 ENCOUNTER — Other Ambulatory Visit: Payer: Self-pay

## 2021-12-02 ENCOUNTER — Emergency Department (HOSPITAL_COMMUNITY)
Admission: EM | Admit: 2021-12-02 | Discharge: 2021-12-02 | Disposition: A | Discharge status: Home / Self Care | Payer: 59 | Attending: Emergency Medicine | Admitting: Emergency Medicine

## 2021-12-02 ENCOUNTER — Emergency Department (HOSPITAL_COMMUNITY): Payer: 59

## 2021-12-02 ENCOUNTER — Encounter (HOSPITAL_COMMUNITY): Payer: Self-pay

## 2021-12-02 DIAGNOSIS — S161XXA Strain of muscle, fascia and tendon at neck level, initial encounter: Secondary | ICD-10-CM | POA: Diagnosis not present

## 2021-12-02 DIAGNOSIS — Y92410 Unspecified street and highway as the place of occurrence of the external cause: Secondary | ICD-10-CM | POA: Diagnosis not present

## 2021-12-02 DIAGNOSIS — S46911A Strain of unspecified muscle, fascia and tendon at shoulder and upper arm level, right arm, initial encounter: Secondary | ICD-10-CM | POA: Diagnosis not present

## 2021-12-02 DIAGNOSIS — S199XXA Unspecified injury of neck, initial encounter: Secondary | ICD-10-CM | POA: Diagnosis present

## 2021-12-02 MED ORDER — NAPROXEN 250 MG PO TABS
500.0000 mg | ORAL_TABLET | Freq: Once | ORAL | Status: AC
  Administered 2021-12-02: 500 mg via ORAL
  Filled 2021-12-02: qty 2

## 2021-12-02 MED ORDER — METHOCARBAMOL 500 MG PO TABS: 500.0000 mg | ORAL_TABLET | Freq: Two times a day (BID) | ORAL | 0 refills | Status: AC

## 2021-12-02 MED ORDER — ACETAMINOPHEN 500 MG PO TABS: 500.0000 mg | ORAL_TABLET | Freq: Four times a day (QID) | ORAL | 0 refills | Status: AC | PRN

## 2021-12-02 MED ORDER — IBUPROFEN 600 MG PO TABS: 600.0000 mg | ORAL_TABLET | Freq: Four times a day (QID) | ORAL | 0 refills | Status: AC | PRN

## 2021-12-02 MED ORDER — HYDROCODONE-ACETAMINOPHEN 5-325 MG PO TABS
1.0000 | ORAL_TABLET | Freq: Once | ORAL | Status: AC
  Administered 2021-12-02: 1 via ORAL
  Filled 2021-12-02: qty 1

## 2021-12-02 NOTE — ED Notes (Addendum)
EDP at bedside  

## 2021-12-02 NOTE — ED Notes (Signed)
ED Provider at bedside. 

## 2021-12-02 NOTE — ED Notes (Signed)
Pt taken to scans  
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
## 2021-12-02 NOTE — ED Provider Notes (Signed)
Patient signed to me by Dr. Rhunette Croft pending results of imaging.  Patient's CT of cervical spine showed possible lytic lesions.  Patient had been discharged prior to results of this.  I spoke with Dr. Rhunette Croft and he will follow-up with the patient   Lorre Nick, MD 12/02/21 2204

## 2021-12-02 NOTE — ED Notes (Signed)
Report received, assumed care of patient at this time.  

## 2021-12-07 ENCOUNTER — Other Ambulatory Visit: Payer: Self-pay | Admitting: Internal Medicine

## 2021-12-07 DIAGNOSIS — R9389 Abnormal findings on diagnostic imaging of other specified body structures: Secondary | ICD-10-CM

## 2021-12-07 DIAGNOSIS — M899 Disorder of bone, unspecified: Secondary | ICD-10-CM

## 2021-12-30 ENCOUNTER — Ambulatory Visit
Admission: RE | Admit: 2021-12-30 | Discharge: 2021-12-30 | Disposition: A | Discharge status: Home / Self Care | Payer: 59 | Source: Ambulatory Visit | Attending: Internal Medicine | Admitting: Internal Medicine

## 2021-12-30 DIAGNOSIS — M899 Disorder of bone, unspecified: Secondary | ICD-10-CM

## 2021-12-30 DIAGNOSIS — R9389 Abnormal findings on diagnostic imaging of other specified body structures: Secondary | ICD-10-CM

## 2021-12-30 DIAGNOSIS — M898X9 Other specified disorders of bone, unspecified site: Secondary | ICD-10-CM

## 2021-12-30 MED ORDER — GADOPICLENOL 0.5 MMOL/ML IV SOLN
7.5000 mL | Freq: Once | INTRAVENOUS | Status: AC | PRN
  Administered 2021-12-30: 7.5 mL via INTRAVENOUS

## 2022-04-20 ENCOUNTER — Other Ambulatory Visit: Payer: Self-pay | Admitting: Internal Medicine

## 2022-04-20 DIAGNOSIS — M899 Disorder of bone, unspecified: Secondary | ICD-10-CM

## 2022-05-15 ENCOUNTER — Encounter: Payer: Self-pay | Admitting: Internal Medicine

## 2022-05-16 ENCOUNTER — Ambulatory Visit
Admission: RE | Admit: 2022-05-16 | Discharge: 2022-05-16 | Disposition: A | Discharge status: Home / Self Care | Payer: 59 | Source: Ambulatory Visit | Attending: Internal Medicine | Admitting: Internal Medicine

## 2022-05-16 DIAGNOSIS — M899 Disorder of bone, unspecified: Secondary | ICD-10-CM

## 2022-05-16 MED ORDER — GADOPICLENOL 0.5 MMOL/ML IV SOLN: 8.0000 mL | Freq: Once | INTRAVENOUS | Status: DC | PRN

## 2022-05-16 MED ORDER — GADOPICLENOL 0.5 MMOL/ML IV SOLN
8.0000 mL | Freq: Once | INTRAVENOUS | Status: AC | PRN
  Administered 2022-05-16: 8 mL via INTRAVENOUS
